# Patient Record
Sex: Female | Born: 1945 | Race: White | Hispanic: No | Marital: Married
Health system: Southern US, Community
[De-identification: ages and names within clinical notes are randomized; demographics above are authoritative.]

---

## 2004-09-22 ENCOUNTER — Inpatient Hospital Stay: Payer: Self-pay | Admitting: General Practice

## 2004-11-28 ENCOUNTER — Emergency Department: Payer: Self-pay | Admitting: Emergency Medicine

## 2004-11-29 ENCOUNTER — Ambulatory Visit: Payer: Self-pay

## 2004-12-14 ENCOUNTER — Ambulatory Visit: Payer: Self-pay

## 2004-12-28 ENCOUNTER — Ambulatory Visit: Payer: Self-pay

## 2005-01-31 ENCOUNTER — Ambulatory Visit: Payer: Self-pay

## 2005-02-13 ENCOUNTER — Ambulatory Visit: Payer: Self-pay | Admitting: Surgery

## 2005-02-20 ENCOUNTER — Observation Stay: Payer: Self-pay | Admitting: Surgery

## 2005-03-22 ENCOUNTER — Ambulatory Visit: Payer: Self-pay | Admitting: Oncology

## 2005-03-24 ENCOUNTER — Other Ambulatory Visit: Payer: Self-pay

## 2005-04-20 ENCOUNTER — Ambulatory Visit: Payer: Self-pay | Admitting: Oncology

## 2005-05-18 ENCOUNTER — Ambulatory Visit: Payer: Self-pay | Admitting: Oncology

## 2005-05-25 ENCOUNTER — Ambulatory Visit: Payer: Self-pay | Admitting: Surgery

## 2005-06-18 ENCOUNTER — Ambulatory Visit: Payer: Self-pay | Admitting: Oncology

## 2005-07-18 ENCOUNTER — Ambulatory Visit: Payer: Self-pay | Admitting: Oncology

## 2005-08-18 ENCOUNTER — Ambulatory Visit: Payer: Self-pay | Admitting: Oncology

## 2005-09-17 ENCOUNTER — Ambulatory Visit: Payer: Self-pay | Admitting: Oncology

## 2005-10-18 ENCOUNTER — Ambulatory Visit: Payer: Self-pay | Admitting: Oncology

## 2005-11-18 ENCOUNTER — Ambulatory Visit: Payer: Self-pay | Admitting: Oncology

## 2005-12-20 ENCOUNTER — Ambulatory Visit: Payer: Self-pay | Admitting: Oncology

## 2006-01-01 ENCOUNTER — Ambulatory Visit: Payer: Self-pay | Admitting: Surgery

## 2006-01-18 ENCOUNTER — Ambulatory Visit: Payer: Self-pay | Admitting: Oncology

## 2006-02-17 ENCOUNTER — Ambulatory Visit: Payer: Self-pay | Admitting: Oncology

## 2006-03-20 ENCOUNTER — Ambulatory Visit: Payer: Self-pay | Admitting: Oncology

## 2006-05-16 ENCOUNTER — Emergency Department: Payer: Self-pay | Admitting: Emergency Medicine

## 2006-05-18 ENCOUNTER — Ambulatory Visit: Payer: Self-pay | Admitting: Emergency Medicine

## 2006-06-06 ENCOUNTER — Ambulatory Visit: Payer: Self-pay | Admitting: Oncology

## 2006-06-19 ENCOUNTER — Ambulatory Visit: Payer: Self-pay | Admitting: Oncology

## 2006-07-19 ENCOUNTER — Ambulatory Visit: Payer: Self-pay | Admitting: Oncology

## 2006-08-19 ENCOUNTER — Ambulatory Visit: Payer: Self-pay | Admitting: Internal Medicine

## 2006-09-06 ENCOUNTER — Ambulatory Visit: Payer: Self-pay | Admitting: Oncology

## 2006-09-18 ENCOUNTER — Ambulatory Visit: Payer: Self-pay | Admitting: Oncology

## 2006-09-18 ENCOUNTER — Ambulatory Visit: Payer: Self-pay | Admitting: Internal Medicine

## 2006-10-19 ENCOUNTER — Ambulatory Visit: Payer: Self-pay | Admitting: Oncology

## 2006-11-01 ENCOUNTER — Ambulatory Visit: Payer: Self-pay | Admitting: Oncology

## 2006-11-19 ENCOUNTER — Ambulatory Visit: Payer: Self-pay | Admitting: Oncology

## 2006-12-05 ENCOUNTER — Ambulatory Visit: Payer: Self-pay | Admitting: Surgery

## 2006-12-19 ENCOUNTER — Ambulatory Visit: Payer: Self-pay | Admitting: Oncology

## 2006-12-25 ENCOUNTER — Ambulatory Visit: Payer: Self-pay | Admitting: Surgery

## 2006-12-27 ENCOUNTER — Ambulatory Visit: Payer: Self-pay | Admitting: Surgery

## 2007-01-19 ENCOUNTER — Ambulatory Visit: Payer: Self-pay | Admitting: Oncology

## 2007-01-30 ENCOUNTER — Ambulatory Visit: Payer: Self-pay | Admitting: Oncology

## 2007-02-11 ENCOUNTER — Ambulatory Visit: Payer: Self-pay | Admitting: Surgery

## 2007-02-18 ENCOUNTER — Ambulatory Visit: Payer: Self-pay | Admitting: Oncology

## 2007-04-04 ENCOUNTER — Ambulatory Visit: Payer: Self-pay | Admitting: Oncology

## 2007-04-21 ENCOUNTER — Ambulatory Visit: Payer: Self-pay | Admitting: Oncology

## 2007-05-19 ENCOUNTER — Ambulatory Visit: Payer: Self-pay | Admitting: Oncology

## 2007-06-19 ENCOUNTER — Ambulatory Visit: Payer: Self-pay | Admitting: Oncology

## 2007-07-19 ENCOUNTER — Ambulatory Visit: Payer: Self-pay | Admitting: Oncology

## 2007-08-06 ENCOUNTER — Ambulatory Visit: Payer: Self-pay | Admitting: Oncology

## 2007-08-19 ENCOUNTER — Ambulatory Visit: Payer: Self-pay | Admitting: Oncology

## 2007-09-18 ENCOUNTER — Ambulatory Visit: Payer: Self-pay | Admitting: Oncology

## 2007-10-19 ENCOUNTER — Ambulatory Visit: Payer: Self-pay | Admitting: Oncology

## 2007-11-19 ENCOUNTER — Ambulatory Visit: Payer: Self-pay | Admitting: Oncology

## 2008-03-23 ENCOUNTER — Ambulatory Visit: Payer: Self-pay | Admitting: Surgery

## 2008-04-20 ENCOUNTER — Ambulatory Visit: Payer: Self-pay | Admitting: Oncology

## 2008-05-12 ENCOUNTER — Ambulatory Visit: Payer: Self-pay | Admitting: Oncology

## 2008-05-18 ENCOUNTER — Ambulatory Visit: Payer: Self-pay | Admitting: Oncology

## 2008-08-18 ENCOUNTER — Ambulatory Visit: Payer: Self-pay | Admitting: Oncology

## 2008-09-16 ENCOUNTER — Ambulatory Visit: Payer: Self-pay | Admitting: Oncology

## 2008-09-17 ENCOUNTER — Ambulatory Visit: Payer: Self-pay | Admitting: Oncology

## 2008-10-18 ENCOUNTER — Ambulatory Visit: Payer: Self-pay | Admitting: Oncology

## 2008-11-16 ENCOUNTER — Ambulatory Visit: Payer: Self-pay | Admitting: Oncology

## 2008-11-18 ENCOUNTER — Ambulatory Visit: Payer: Self-pay | Admitting: Oncology

## 2009-02-16 ENCOUNTER — Ambulatory Visit: Payer: Self-pay | Admitting: Oncology

## 2009-02-17 ENCOUNTER — Ambulatory Visit: Payer: Self-pay | Admitting: Oncology

## 2009-03-25 ENCOUNTER — Ambulatory Visit: Payer: Self-pay | Admitting: Family Medicine

## 2009-04-20 ENCOUNTER — Ambulatory Visit: Payer: Self-pay | Admitting: Oncology

## 2009-05-17 ENCOUNTER — Ambulatory Visit: Payer: Self-pay | Admitting: Oncology

## 2009-05-18 ENCOUNTER — Ambulatory Visit: Payer: Self-pay | Admitting: Oncology

## 2009-06-18 ENCOUNTER — Ambulatory Visit: Payer: Self-pay | Admitting: Oncology

## 2009-07-18 ENCOUNTER — Ambulatory Visit: Payer: Self-pay | Admitting: Oncology

## 2009-10-18 ENCOUNTER — Ambulatory Visit: Payer: Self-pay | Admitting: Oncology

## 2009-11-15 ENCOUNTER — Ambulatory Visit: Payer: Self-pay | Admitting: Oncology

## 2009-11-16 LAB — CANCER ANTIGEN 27.29: CA 27.29: 17.3 U/mL (ref 0.0–38.6)

## 2009-11-18 ENCOUNTER — Ambulatory Visit: Payer: Self-pay | Admitting: Oncology

## 2010-03-31 ENCOUNTER — Ambulatory Visit: Payer: Self-pay | Admitting: Oncology

## 2010-05-17 ENCOUNTER — Ambulatory Visit: Payer: Self-pay | Admitting: Oncology

## 2010-05-18 LAB — CANCER ANTIGEN 27.29: CA 27.29: 31.1 U/mL (ref 0.0–38.6)

## 2010-05-19 ENCOUNTER — Ambulatory Visit: Payer: Self-pay | Admitting: Oncology

## 2010-11-25 ENCOUNTER — Ambulatory Visit: Payer: Self-pay | Admitting: Oncology

## 2010-11-26 LAB — CANCER ANTIGEN 27.29: CA 27.29: 38.7 U/mL — ABNORMAL HIGH (ref 0.0–38.6)

## 2010-12-19 ENCOUNTER — Ambulatory Visit: Payer: Self-pay | Admitting: Oncology

## 2010-12-20 ENCOUNTER — Ambulatory Visit: Payer: Self-pay | Admitting: Oncology

## 2010-12-21 ENCOUNTER — Ambulatory Visit: Payer: Self-pay | Admitting: Oncology

## 2011-01-04 ENCOUNTER — Ambulatory Visit: Payer: Self-pay | Admitting: Anesthesiology

## 2011-01-06 ENCOUNTER — Ambulatory Visit: Payer: Self-pay | Admitting: Surgery

## 2011-01-11 ENCOUNTER — Ambulatory Visit: Payer: Self-pay | Admitting: Oncology

## 2011-01-13 LAB — PATHOLOGY REPORT

## 2011-01-19 ENCOUNTER — Ambulatory Visit: Payer: Self-pay | Admitting: Oncology

## 2011-01-19 ENCOUNTER — Ambulatory Visit: Payer: Self-pay | Admitting: Internal Medicine

## 2011-01-30 ENCOUNTER — Emergency Department: Payer: Self-pay | Admitting: Emergency Medicine

## 2011-01-30 ENCOUNTER — Ambulatory Visit: Payer: Self-pay | Admitting: Internal Medicine

## 2011-02-07 ENCOUNTER — Inpatient Hospital Stay: Payer: Self-pay | Admitting: Oncology

## 2011-02-17 ENCOUNTER — Ambulatory Visit: Payer: Self-pay | Admitting: Nephrology

## 2011-02-18 ENCOUNTER — Ambulatory Visit: Payer: Self-pay | Admitting: Internal Medicine

## 2011-02-18 ENCOUNTER — Ambulatory Visit: Payer: Self-pay | Admitting: Oncology

## 2011-03-19 ENCOUNTER — Ambulatory Visit: Payer: Self-pay

## 2011-03-21 ENCOUNTER — Ambulatory Visit: Payer: Self-pay | Admitting: Oncology

## 2011-03-21 ENCOUNTER — Ambulatory Visit: Payer: Self-pay | Admitting: Internal Medicine

## 2011-03-22 LAB — CBC CANCER CENTER
Bands: 2 %
Basophil #: 0 x10 3/mm (ref 0.0–0.1)
Lymphocyte #: 0.8 x10 3/mm — ABNORMAL LOW (ref 1.0–3.6)
Lymphocytes: 25 %
MCH: 31.7 pg (ref 26.0–34.0)
MCV: 93.8 fL (ref 80–100)
Metamyelocyte: 1 %
Monocyte #: 0.6 x10 3/mm (ref 0.0–0.7)
Monocyte %: 23.8 %
Monocytes: 22 %
Neutrophil %: 46.6 %
Platelet: 196 x10 3/mm (ref 150–440)
RBC: 2.98 10*6/uL — ABNORMAL LOW (ref 3.80–5.20)
RDW: 15 % — ABNORMAL HIGH (ref 11.5–14.5)
WBC: 2.7 x10 3/mm — ABNORMAL LOW (ref 3.6–11.0)

## 2011-03-22 LAB — COMPREHENSIVE METABOLIC PANEL
Albumin: 2.2 g/dL — ABNORMAL LOW (ref 3.4–5.0)
Alkaline Phosphatase: 145 U/L — ABNORMAL HIGH (ref 50–136)
Anion Gap: 9 (ref 7–16)
BUN: 10 mg/dL (ref 7–18)
Bilirubin,Total: 0.9 mg/dL (ref 0.2–1.0)
Creatinine: 1.03 mg/dL (ref 0.60–1.30)
EGFR (African American): >60
Glucose: 126 mg/dL — ABNORMAL HIGH (ref 65–99)
Osmolality: 271 (ref 275–301)
Potassium: 2.7 mmol/L — ABNORMAL LOW (ref 3.5–5.1)
SGOT(AST): 28 U/L (ref 15–37)
SGPT (ALT): 15 U/L
Sodium: 135 mmol/L — ABNORMAL LOW (ref 136–145)
Total Protein: 5.6 g/dL — ABNORMAL LOW (ref 6.4–8.2)

## 2011-03-29 LAB — CBC CANCER CENTER
Basophil #: 0.1 x10 3/mm (ref 0.0–0.1)
Basophil %: 0.4 %
HCT: 29.8 % — ABNORMAL LOW (ref 35.0–47.0)
HGB: 10.2 g/dL — ABNORMAL LOW (ref 12.0–16.0)
Lymphocyte #: 1.6 x10 3/mm (ref 1.0–3.6)
Lymphocyte %: 8.3 %
MCV: 91 fL (ref 80–100)
Monocyte %: 3.8 %
Neutrophil #: 17.4 x10 3/mm — ABNORMAL HIGH (ref 1.4–6.5)
RBC: 3.28 10*6/uL — ABNORMAL LOW (ref 3.80–5.20)
RDW: 14.8 % — ABNORMAL HIGH (ref 11.5–14.5)
WBC: 19.9 x10 3/mm — ABNORMAL HIGH (ref 3.6–11.0)

## 2011-03-29 LAB — BASIC METABOLIC PANEL
Anion Gap: 9 (ref 7–16)
BUN: 11 mg/dL (ref 7–18)
Chloride: 100 mmol/L (ref 98–107)
Co2: 25 mmol/L (ref 21–32)
Creatinine: 0.91 mg/dL (ref 0.60–1.30)
Osmolality: 269 (ref 275–301)
Potassium: 2.8 mmol/L — ABNORMAL LOW (ref 3.5–5.1)
Sodium: 134 mmol/L — ABNORMAL LOW (ref 136–145)

## 2011-04-05 LAB — COMPREHENSIVE METABOLIC PANEL
Albumin: 2.3 g/dL — ABNORMAL LOW (ref 3.4–5.0)
Alkaline Phosphatase: 152 U/L — ABNORMAL HIGH (ref 50–136)
Bilirubin,Total: 1 mg/dL (ref 0.2–1.0)
Co2: 28 mmol/L (ref 21–32)
Creatinine: 0.95 mg/dL (ref 0.60–1.30)
EGFR (African American): >60
EGFR (Non-African Amer.): >60
SGPT (ALT): 17 U/L

## 2011-04-05 LAB — CBC CANCER CENTER
Basophil #: 0 x10 3/mm (ref 0.0–0.1)
Basophil %: 0.4 %
Eosinophil %: 0 %
HGB: 10.2 g/dL — ABNORMAL LOW (ref 12.0–16.0)
Lymphocyte %: 21.7 %
Monocyte %: 10.2 %
Neutrophil %: 67.7 %
RBC: 3.23 10*6/uL — ABNORMAL LOW (ref 3.80–5.20)
WBC: 4.5 x10 3/mm (ref 3.6–11.0)

## 2011-04-12 LAB — CBC CANCER CENTER
Eosinophil #: 0 x10 3/mm (ref 0.0–0.7)
Eosinophil %: 0.1 %
HGB: 9.2 g/dL — ABNORMAL LOW (ref 12.0–16.0)
Lymphocyte #: 0.7 x10 3/mm — ABNORMAL LOW (ref 1.0–3.6)
MCH: 31.1 pg (ref 26.0–34.0)
MCHC: 33.5 g/dL (ref 32.0–36.0)
MCV: 92.7 fL (ref 80–100)
Monocyte #: 0.3 x10 3/mm (ref 0.0–0.7)
Neutrophil %: 22.3 %
Platelet: 168 x10 3/mm (ref 150–440)
RBC: 2.96 10*6/uL — ABNORMAL LOW (ref 3.80–5.20)
RDW: 16.3 % — ABNORMAL HIGH (ref 11.5–14.5)

## 2011-04-12 LAB — COMPREHENSIVE METABOLIC PANEL
Anion Gap: 9 (ref 7–16)
BUN: 10 mg/dL (ref 7–18)
Calcium, Total: 8.3 mg/dL — ABNORMAL LOW (ref 8.5–10.1)
Chloride: 106 mmol/L (ref 98–107)
Co2: 25 mmol/L (ref 21–32)
Creatinine: 0.94 mg/dL (ref 0.60–1.30)
EGFR (African American): >60
EGFR (Non-African Amer.): >60
Potassium: 3.2 mmol/L — ABNORMAL LOW (ref 3.5–5.1)
SGOT(AST): 44 U/L — ABNORMAL HIGH (ref 15–37)
Total Protein: 5.5 g/dL — ABNORMAL LOW (ref 6.4–8.2)

## 2011-04-19 LAB — CBC CANCER CENTER
Basophil %: 0.5 %
Eosinophil %: 0.1 %
HCT: 31.6 % — ABNORMAL LOW (ref 35.0–47.0)
Lymphocyte #: 0.9 x10 3/mm — ABNORMAL LOW (ref 1.0–3.6)
Lymphocyte %: 18.7 %
MCV: 92.1 fL (ref 80–100)
Monocyte %: 13.5 %
Neutrophil %: 67.2 %
Platelet: 260 x10 3/mm (ref 150–440)
RBC: 3.44 10*6/uL — ABNORMAL LOW (ref 3.80–5.20)

## 2011-04-19 LAB — COMPREHENSIVE METABOLIC PANEL
Albumin: 2.5 g/dL — ABNORMAL LOW (ref 3.4–5.0)
Alkaline Phosphatase: 163 U/L — ABNORMAL HIGH (ref 50–136)
BUN: 10 mg/dL (ref 7–18)
Bilirubin,Total: 0.9 mg/dL (ref 0.2–1.0)
Chloride: 107 mmol/L (ref 98–107)
Co2: 25 mmol/L (ref 21–32)
Creatinine: 0.92 mg/dL (ref 0.60–1.30)
EGFR (African American): >60
Glucose: 105 mg/dL — ABNORMAL HIGH (ref 65–99)
Osmolality: 281 (ref 275–301)
SGOT(AST): 45 U/L — ABNORMAL HIGH (ref 15–37)
SGPT (ALT): 21 U/L
Total Protein: 5.7 g/dL — ABNORMAL LOW (ref 6.4–8.2)

## 2011-04-21 ENCOUNTER — Ambulatory Visit: Payer: Self-pay | Admitting: Oncology

## 2011-04-24 ENCOUNTER — Emergency Department: Payer: Self-pay | Admitting: Emergency Medicine

## 2011-04-24 LAB — URINALYSIS, COMPLETE
Bacteria: NEGATIVE
Ketone: NEGATIVE
Nitrite: NEGATIVE
Ph: 6 (ref 4.5–8.0)
Protein: NEGATIVE
Specific Gravity: 1.005 (ref 1.003–1.030)
Squamous Epithelial: 3
WBC UR: NONE SEEN /HPF (ref 0–5)
WBC UR: 2 /HPF (ref 0–5)

## 2011-04-24 LAB — CBC WITH DIFFERENTIAL/PLATELET
Basophil: 2 %
Lymphocytes: 9 %
MCHC: 33.4 g/dL (ref 32.0–36.0)
Monocytes: 6 %
Myelocyte: 1 %
NRBC/100 WBC: 1 /
Other Cells Blood: 1
Platelet: 129 10*3/uL — ABNORMAL LOW (ref 150–440)
RDW: 18.2 % — ABNORMAL HIGH (ref 11.5–14.5)
Segmented Neutrophils: 62 %
Variant Lymphocyte - H1-Rlymph: 2 %
WBC: 4 10*3/uL (ref 3.6–11.0)

## 2011-04-24 LAB — BASIC METABOLIC PANEL
BUN: 9 mg/dL (ref 7–18)
Calcium, Total: 8.5 mg/dL (ref 8.5–10.1)
Chloride: 107 mmol/L (ref 98–107)
Co2: 19 mmol/L — ABNORMAL LOW (ref 21–32)
Creatinine: 0.84 mg/dL (ref 0.60–1.30)
EGFR (Non-African Amer.): >60
Osmolality: 278 (ref 275–301)
Sodium: 141 mmol/L (ref 136–145)

## 2011-04-24 LAB — PROTIME-INR
INR: 1.2
Prothrombin Time: 15.7 secs — ABNORMAL HIGH (ref 11.5–14.7)

## 2011-04-24 LAB — HEPATIC FUNCTION PANEL A (ARMC)
Alkaline Phosphatase: 162 U/L — ABNORMAL HIGH (ref 50–136)
SGOT(AST): 66 U/L — ABNORMAL HIGH (ref 15–37)
SGPT (ALT): 25 U/L
Total Protein: 5.6 g/dL — ABNORMAL LOW (ref 6.4–8.2)

## 2011-04-26 LAB — COMPREHENSIVE METABOLIC PANEL
Alkaline Phosphatase: 187 U/L — ABNORMAL HIGH (ref 50–136)
BUN: 9 mg/dL (ref 7–18)
Bilirubin,Total: 1 mg/dL (ref 0.2–1.0)
Chloride: 106 mmol/L (ref 98–107)
Co2: 24 mmol/L (ref 21–32)
Creatinine: 1.04 mg/dL (ref 0.60–1.30)
EGFR (Non-African Amer.): 57 — ABNORMAL LOW
SGOT(AST): 53 U/L — ABNORMAL HIGH (ref 15–37)
SGPT (ALT): 22 U/L
Sodium: 138 mmol/L (ref 136–145)
Total Protein: 5.3 g/dL — ABNORMAL LOW (ref 6.4–8.2)

## 2011-04-26 LAB — CBC CANCER CENTER
Basophil #: 0.1 x10 3/mm (ref 0.0–0.1)
Eosinophil %: 0 %
HCT: 26.8 % — ABNORMAL LOW (ref 35.0–47.0)
HGB: 8.8 g/dL — ABNORMAL LOW (ref 12.0–16.0)
Lymphocyte %: 11.7 %
Monocyte %: 6.4 %
Neutrophil %: 80.8 %
Platelet: 147 x10 3/mm — ABNORMAL LOW (ref 150–440)
RBC: 2.97 10*6/uL — ABNORMAL LOW (ref 3.80–5.20)
RDW: 17.2 % — ABNORMAL HIGH (ref 11.5–14.5)
WBC: 10.4 x10 3/mm (ref 3.6–11.0)

## 2011-05-03 LAB — COMPREHENSIVE METABOLIC PANEL
Albumin: 2.5 g/dL — ABNORMAL LOW (ref 3.4–5.0)
Alkaline Phosphatase: 156 U/L — ABNORMAL HIGH (ref 50–136)
Anion Gap: 7 (ref 7–16)
BUN: 12 mg/dL (ref 7–18)
Chloride: 108 mmol/L — ABNORMAL HIGH (ref 98–107)
Co2: 26 mmol/L (ref 21–32)
Creatinine: 0.93 mg/dL (ref 0.60–1.30)
Osmolality: 280 (ref 275–301)
Sodium: 141 mmol/L (ref 136–145)
Total Protein: 5.5 g/dL — ABNORMAL LOW (ref 6.4–8.2)

## 2011-05-03 LAB — CBC CANCER CENTER
Basophil #: 0 x10 3/mm (ref 0.0–0.1)
Basophil %: 0.4 %
Eosinophil %: 0.1 %
HCT: 30 % — ABNORMAL LOW (ref 35.0–47.0)
HGB: 9.7 g/dL — ABNORMAL LOW (ref 12.0–16.0)
Lymphocyte #: 0.8 x10 3/mm — ABNORMAL LOW (ref 1.0–3.6)
MCH: 29.5 pg (ref 26.0–34.0)
MCV: 91.5 fL (ref 80–100)
Monocyte #: 0.4 x10 3/mm (ref 0.0–0.7)
Neutrophil #: 3.9 x10 3/mm (ref 1.4–6.5)
RBC: 3.28 10*6/uL — ABNORMAL LOW (ref 3.80–5.20)
WBC: 5.1 x10 3/mm (ref 3.6–11.0)

## 2011-05-19 ENCOUNTER — Ambulatory Visit: Payer: Self-pay | Admitting: Oncology

## 2011-05-23 LAB — CBC CANCER CENTER
Basophil %: 0.8 %
Eosinophil #: 0 x10 3/mm (ref 0.0–0.7)
Eosinophil %: 0.3 %
HGB: 8.5 g/dL — ABNORMAL LOW (ref 12.0–16.0)
Lymphocyte #: 0.7 x10 3/mm — ABNORMAL LOW (ref 1.0–3.6)
MCH: 30.4 pg (ref 26.0–34.0)
MCHC: 33.7 g/dL (ref 32.0–36.0)
MCV: 90 fL (ref 80–100)
Monocyte #: 0.4 x10 3/mm (ref 0.0–0.7)
Neutrophil %: 62.6 %
Platelet: 183 x10 3/mm (ref 150–440)

## 2011-05-23 LAB — BASIC METABOLIC PANEL
Anion Gap: 12 (ref 7–16)
Calcium, Total: 8.1 mg/dL — ABNORMAL LOW (ref 8.5–10.1)
Chloride: 106 mmol/L (ref 98–107)
Co2: 25 mmol/L (ref 21–32)
Creatinine: 0.9 mg/dL (ref 0.60–1.30)
EGFR (African American): >60
Glucose: 70 mg/dL (ref 65–99)
Osmolality: 284 (ref 275–301)
Potassium: 3.3 mmol/L — ABNORMAL LOW (ref 3.5–5.1)

## 2011-05-26 ENCOUNTER — Encounter: Payer: Self-pay | Admitting: Nurse Practitioner

## 2011-05-26 ENCOUNTER — Encounter: Payer: Self-pay | Admitting: Cardiothoracic Surgery

## 2011-06-05 LAB — CBC CANCER CENTER
Basophil #: 0 x10 3/mm (ref 0.0–0.1)
Eosinophil #: 0.1 x10 3/mm (ref 0.0–0.7)
Eosinophil %: 4.2 %
HCT: 27.2 % — ABNORMAL LOW (ref 35.0–47.0)
Lymphocyte #: 0.5 x10 3/mm — ABNORMAL LOW (ref 1.0–3.6)
MCH: 31.4 pg (ref 26.0–34.0)
MCV: 92 fL (ref 80–100)
Neutrophil #: 1.8 x10 3/mm (ref 1.4–6.5)
Neutrophil %: 67 %
Platelet: 195 x10 3/mm (ref 150–440)
RBC: 2.96 10*6/uL — ABNORMAL LOW (ref 3.80–5.20)
RDW: 21.9 % — ABNORMAL HIGH (ref 11.5–14.5)

## 2011-06-12 LAB — CBC CANCER CENTER
Basophil %: 0.3 %
Eosinophil %: 4.9 %
HCT: 27.8 % — ABNORMAL LOW (ref 35.0–47.0)
HGB: 9.5 g/dL — ABNORMAL LOW (ref 12.0–16.0)
Lymphocyte #: 0.4 x10 3/mm — ABNORMAL LOW (ref 1.0–3.6)
Lymphocyte %: 20.6 %
MCHC: 34.3 g/dL (ref 32.0–36.0)
Monocyte #: 0.2 x10 3/mm (ref 0.0–0.7)
Monocyte %: 10.4 %
Neutrophil #: 1.3 x10 3/mm — ABNORMAL LOW (ref 1.4–6.5)
Neutrophil %: 63.8 %
WBC: 2 x10 3/mm — CL (ref 3.6–11.0)

## 2011-06-19 ENCOUNTER — Ambulatory Visit: Payer: Self-pay | Admitting: Oncology

## 2011-06-19 ENCOUNTER — Encounter: Payer: Self-pay | Admitting: Cardiothoracic Surgery

## 2011-06-19 ENCOUNTER — Encounter: Payer: Self-pay | Admitting: Nurse Practitioner

## 2011-06-28 LAB — COMPREHENSIVE METABOLIC PANEL
Alkaline Phosphatase: 171 U/L — ABNORMAL HIGH (ref 50–136)
Anion Gap: 5 — ABNORMAL LOW (ref 7–16)
BUN: 15 mg/dL (ref 7–18)
Bilirubin,Total: 0.9 mg/dL (ref 0.2–1.0)
Calcium, Total: 8.5 mg/dL (ref 8.5–10.1)
Chloride: 106 mmol/L (ref 98–107)
Creatinine: 0.94 mg/dL (ref 0.60–1.30)
EGFR (African American): >60
Glucose: 107 mg/dL — ABNORMAL HIGH (ref 65–99)
Osmolality: 277 (ref 275–301)
Potassium: 4 mmol/L (ref 3.5–5.1)
SGPT (ALT): 23 U/L
Sodium: 138 mmol/L (ref 136–145)

## 2011-06-28 LAB — CBC CANCER CENTER
Basophil #: 0 x10 3/mm (ref 0.0–0.1)
Eosinophil #: 0.1 x10 3/mm (ref 0.0–0.7)
Eosinophil %: 7.4 %
Lymphocyte %: 19.5 %
MCH: 32.6 pg (ref 26.0–34.0)
MCHC: 33.6 g/dL (ref 32.0–36.0)
Monocyte %: 12.4 %
Neutrophil #: 1.2 x10 3/mm — ABNORMAL LOW (ref 1.4–6.5)
Platelet: 93 x10 3/mm — ABNORMAL LOW (ref 150–440)
RDW: 18.3 % — ABNORMAL HIGH (ref 11.5–14.5)
WBC: 2 x10 3/mm — CL (ref 3.6–11.0)

## 2011-07-13 ENCOUNTER — Ambulatory Visit: Payer: Self-pay | Admitting: Oncology

## 2011-07-19 ENCOUNTER — Ambulatory Visit: Payer: Self-pay | Admitting: Oncology

## 2011-08-19 ENCOUNTER — Ambulatory Visit: Payer: Self-pay | Admitting: Oncology

## 2011-08-19 DEATH — deceased

## 2012-10-03 IMAGING — US US PELV - US TRANSVAGINAL
1 series · 17 of 25 positions shown · non-contrast
Comparison: none

REASON FOR EXAM: post menopausal bleeding
COMMENTS:

[Series 1: us pelv - us transvaginal · 17 of 47 slices shown]
[im 1/47]
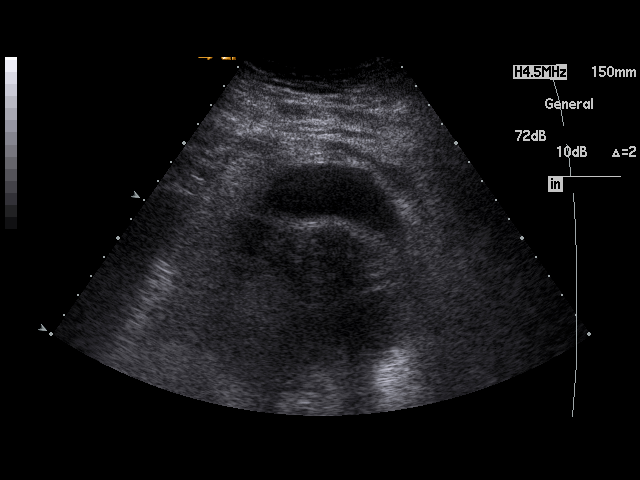
[im 4/47]
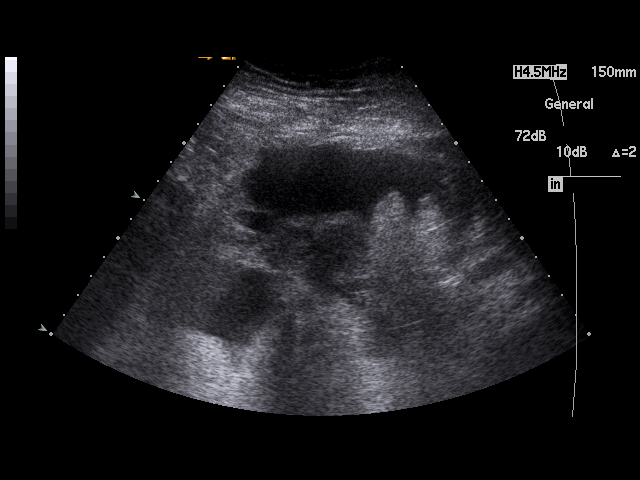
[im 6/47]
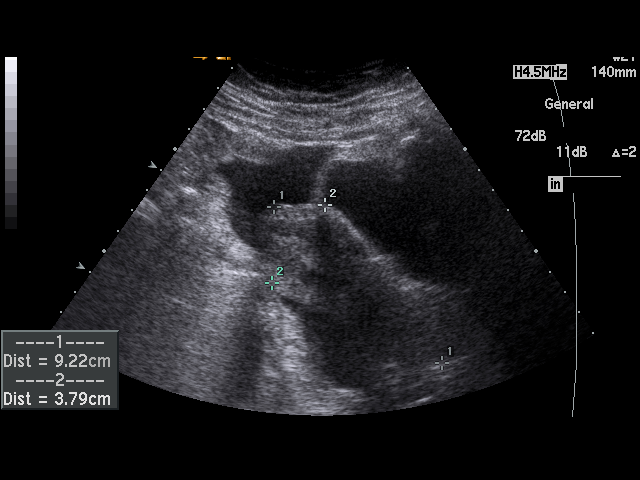
[im 10/47]
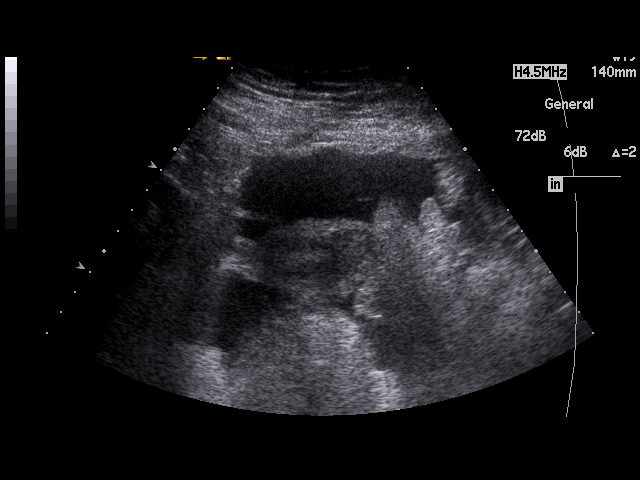
[im 12/47]
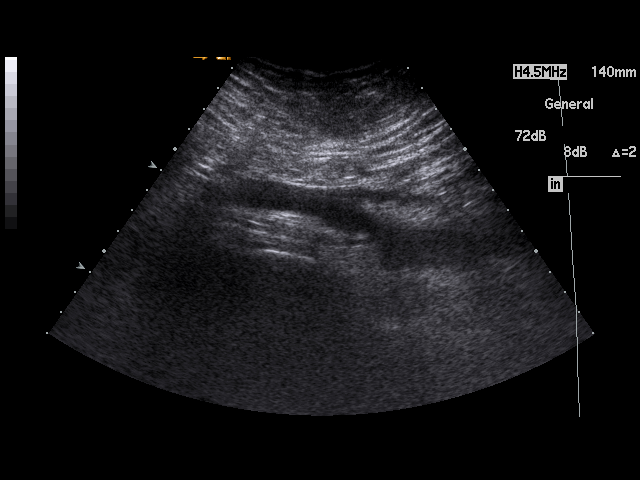
[im 16/47]
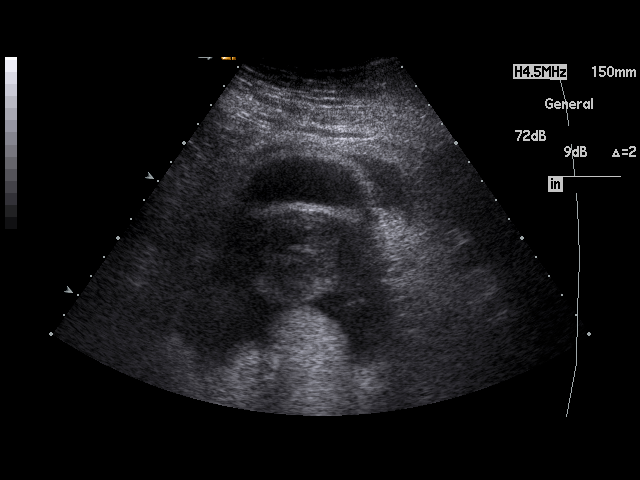
[im 18/47]
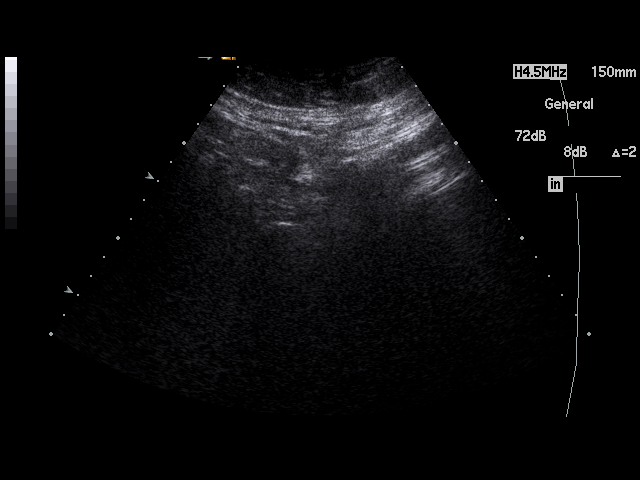
[im 22/47]
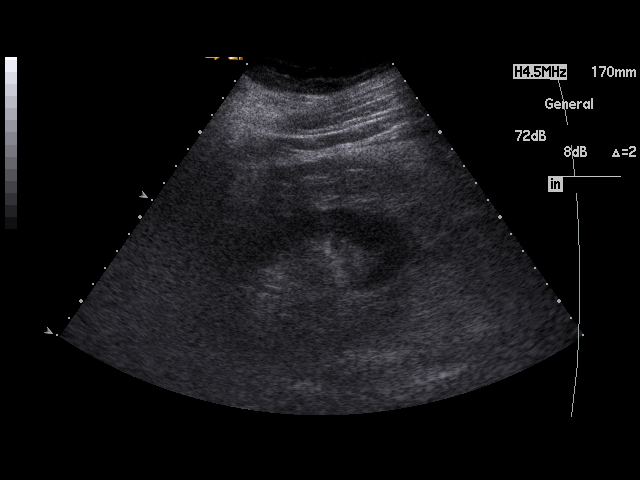
[im 24/47]
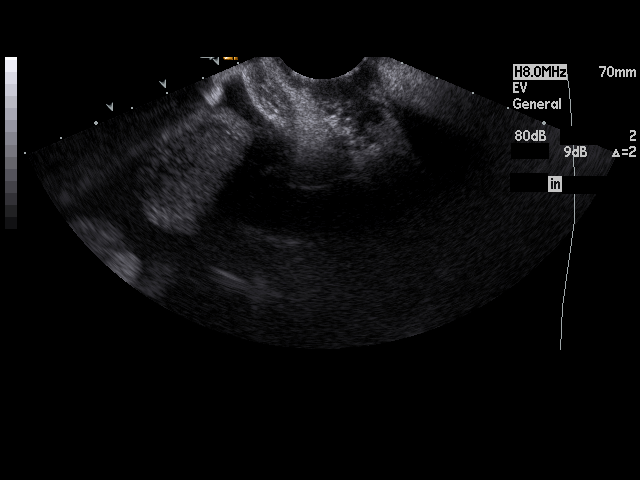
[im 25/47]
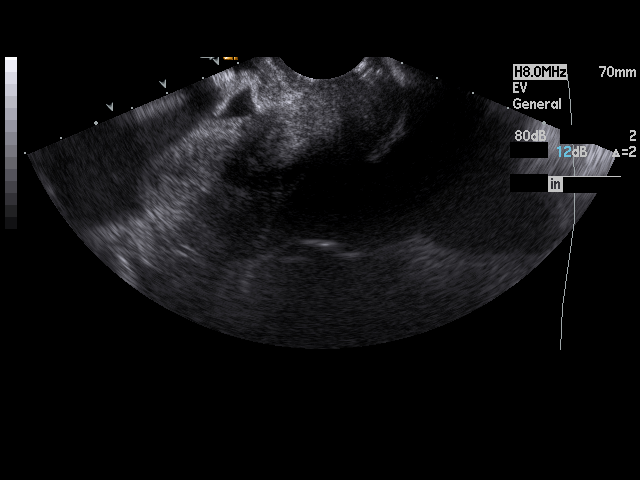
[im 29/47]
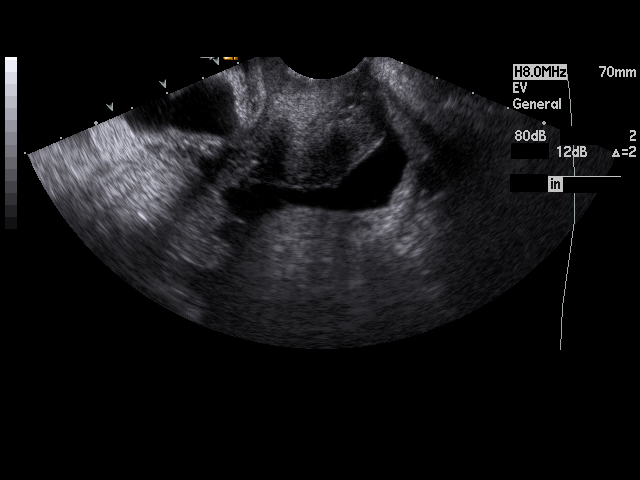
[im 31/47]
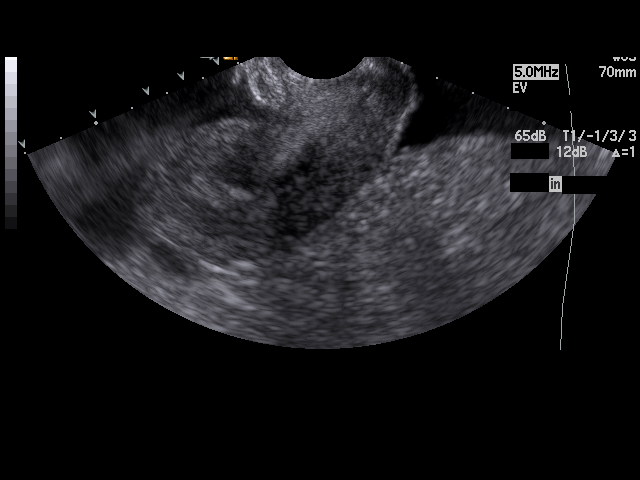
[im 35/47]
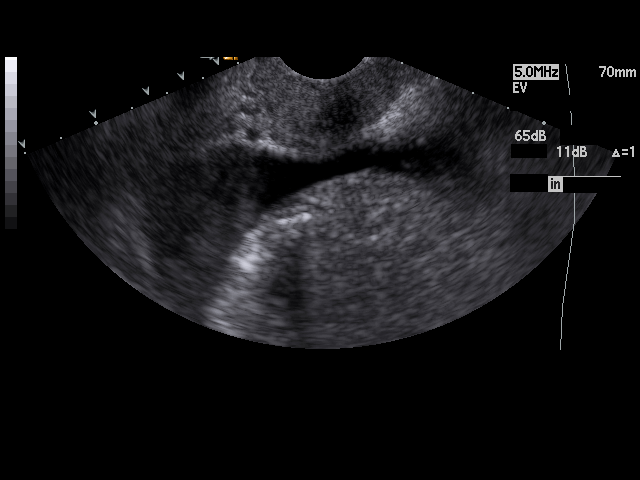
[im 37/47]
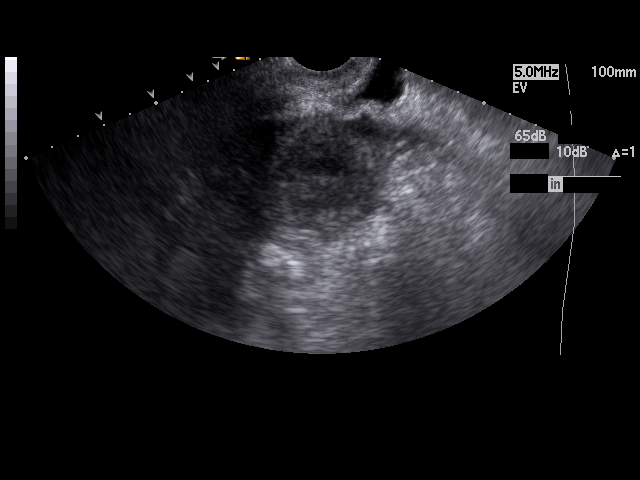
[im 41/47]
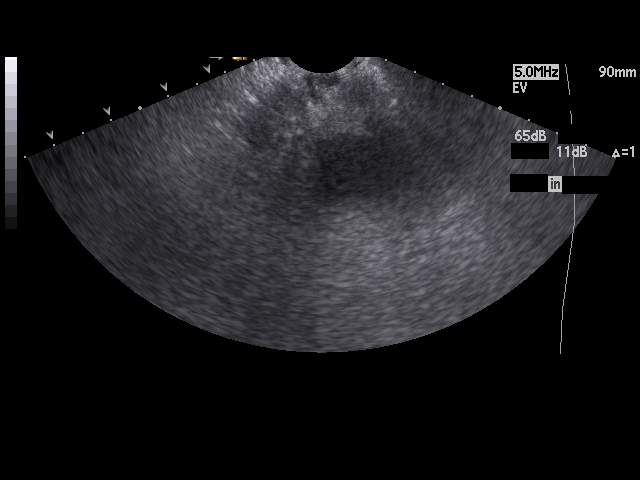
[im 43/47]
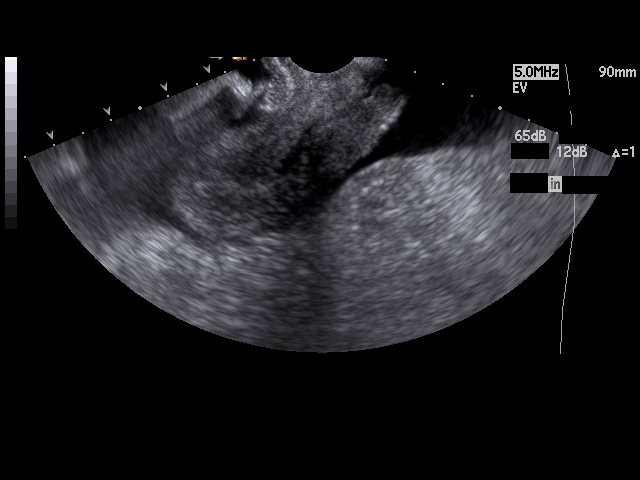
[im 47/47]
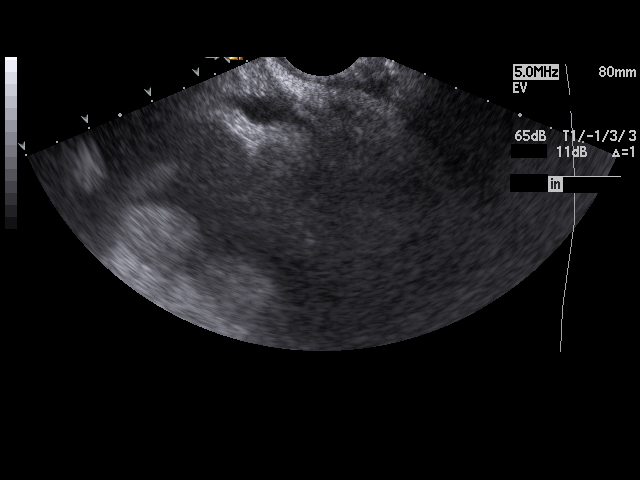

[17 of 25 positions shown; findings below may reference images not displayed]

PROCEDURE:     US  - US PELVIS EXAM W/TRANSVAGINAL  - May 17, 2011  [DATE]

RESULT:     Transabdominal and endovaginal ultrasound was performed. The
uterus measured 9.22 cm x 4.9 cm x 3.79 cm. The endometrium measures 7.1 mm
in thickness which is increased for the postmenopausal patient. No uterine
mass is observed. The ovaries are not seen on either side and apparently are
atrophic or obscured. No abnormal adnexal masses are identified. There is a
moderately large amount of fluid observed in the pelvis. The kidneys are
visualized bilaterally and show no hydronephrosis.
IMPRESSION: 1. There is noted a large amount of fluid in the pelvis. Additionally, fluid
is seen extending to the right upper quadrant and is visualized above the
liver.
2. The uterine endometrium measures 7.1 mm in thickness which is increased
for the patient's age.
3. No uterine mass is seen.
4. Neither ovary is visualized on this exam.

## 2012-11-29 IMAGING — CT NM PET TUM IMG RESTAG (PS) SKULL BASE T - THIGH
1 of 5 series · 1 of 25 positions shown · non-contrast
Comparison: none

REASON FOR EXAM: restaging Breast CA
COMMENTS:

[Series 3: ct wb 3.0 b30f · axial · 3.0mm · 0.98mm/px · 1 of 435 slices shown]
[im 435/435  brain]
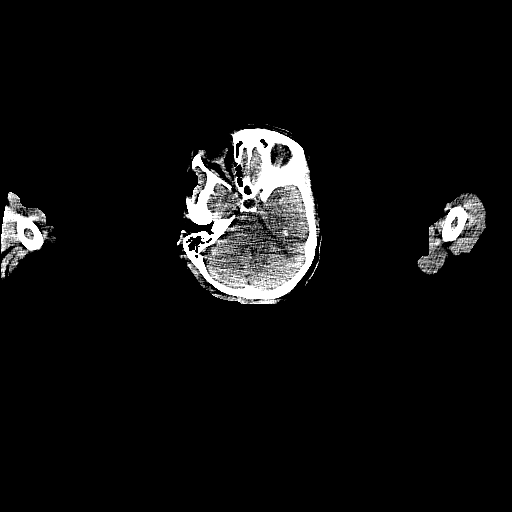

[1 of 25 positions shown; findings below may reference images not displayed]

PROCEDURE:     PET - PET/CT RESTG BREAST CA  - July 13, 2011  [DATE]

RESULT:     The patient has a fasting blood glucose level measuring 90
mg/dL. The patient received an injection of 12.51 mCi of fluorine-39 labeled
fluorodeoxyglucose in the left hand at [DATE] a.m. with imaging obtained from
the base of the brain into the thighs between the hours of [DATE] and [DATE]
a.m. Noncontrast low dose CT of the same regions is performed for the
purposes of attenuation, correction and fusion. The noncontrast CT,
attenuation corrected PET images and fused PET/CT data are reconstructed by
the [employer] software in the axial, coronal and sagittal planes.
Correlation is made with the previous exam dated 21 December, 2010.

The patient has developed a moderately large amount of ascites with a small
left pleural effusion and a trace right pleural effusion. Soft tissue
density is seen over the left buttock region measuring 5.15 x 2.64 cm. This
is in the subcutaneous fatty tissues and is stable compared to the previous
exam. Cholecystectomy clips are present. Atherosclerotic calcification is
present. There is asymmetric increased density in the upper left breast with
evidence of previous left breast surgery. There is no evidence of abnormal
localization of F-18 FDG.
IMPRESSION: 1. No F-18 FDG avid malignant disease evident.
2. CT findings of development of a large amount of ascites and small pleural
effusion.

[REDACTED]

## 2014-07-12 NOTE — Consult Note (Signed)
Reason for Visit: This 69 year old Female patient presents to the clinic for initial evaluation of  Metastatic breast cancer .   Referred by Dr. Grayland Ormond.  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 Gary Fleet care for metastatic breast cancer has been on multiple chemotherapy regimens. About a month ago she presented with vaginal bleeding. He was fairly minimal at 1st. She was seen by Dr. Sabra Heck who performed endocervical curettings and biopsy both positive for metastatic carcinoma consistent with breast primary. She also has skin lesions from stasis changes. She is morbidly obese. I been asked to evaluate her for possible palliative radiation therapy to prevent further bleeding. Bleeding seems to have worsened since biopsy.  Past Hx:    lower back problems: bulging disc   Kidney Stones:    Obesity:    dizziness:    Cancer, Breast:    Oncology Protocol: NSABP B-36   GERD:    Cardiac cath:    Arthritis:    Thyroid problems:    Hypertension:    Port placement/removal:    Right total knee replacement:    Tubaligation:    Heel surgery:    Breast surgery:   Past, Family and Social History:   Past Medical History positive    Cardiovascular hyperlipidemia; hypertension    Gastrointestinal GERD    Genitourinary kidney stones    Endocrine hypothyroidism    Past Surgical History Tubal ligation, he will surgery, breast surgery as described above, right total knee replacement, tubal ligation,     Past Medical History Comments Morbid obesity, dizziness, arthritis    Family History positive    Family History Comments Family history positive for breast cancer and multiple myeloma, adult onset diabetes, hypertension and cardiac cardiac vascular heart disease    Social History positive    Social History Comments Greater than 40-pack-year smoking history    Additional Past Medical and Surgical History Accompanied by husband and family member today   Allergies:   No Known Allergies:   Home Meds:  Home Medications: Medication Instructions Status  Duragesic-25 25 mcg/hr film, extended release 1 PATCH transdermal every 72 hours x 30 days Active  potassium chloride 20 mEq tablet, extended release 1 tab(s) orally once a day x 30 days Active  cyclobenzaprine 10 mg tablet 1 tab(s) orally 3 times a day x 30 days for muscle spasms. Active  synthyroid 0.075 mcg  once a day  Active  aspirin 81 mg oral tablet 1 tab(s) orally once a day Active  acetaminophen-hydrocodone 500 mg-5 mg oral tablet 1 tab(s) orally every 6 to 8 hours x 30 days as needed Active  nystatin topical 100000 units/g cream  Active  glipiZIDE 10 mg oral tablet 2 tab(s) orally 2 times a day Active  bumetanide 1 mg oral tablet 1 tab(s) orally once a day Active   Review of Systems:   General negative    Performance Status (ECOG) 1    Skin see HPI    Breast see HPI    Ophthalmologic negative  ENMT negative    Respiratory and Thorax negative    Cardiovascular negative    Gastrointestinal negative    Genitourinary see HPI    Musculoskeletal negative    Neurological negative    Psychiatric negative    Hematology/Lymphatics negative    Endocrine see HPI    Allergic/Immunologic negative   Physical Exam:  General/Skin/HEENT:   Additional PE Well-developed wheelchair-bound morbidly obese female in NAD. Lungs are clear to A&P cardiac examination shows regular rate and rhythm without murmur rub or thrill.  Abdomen is obese no organomegaly or masses are identified. She does have stasis changes in the groin and lower abdomen. She has +2 bilateral lower extremity edema.   Breasts/Resp/CV/GI/GU:   Respiratory and Thorax normal    Cardiovascular normal    Gastrointestinal normal    Genitourinary normal   MS/Neuro/Psych/Lymph:   Musculoskeletal normal    Neurological normal   Other Results:  Radiology Results: Nuclear Med:    03-Oct-12 12:11, PET/CT Scan Breast CA Stage/Restaging   PET/CT Scan Breast CA Stage/Restaging    REASON FOR EXAM:    Diabetic Metformiin Hx Breast CA With Biopsy    Recurrent Carcinoma LN of Neck  COMMENTS:       PROCEDURE: PET - PET/CT RESTG BREAST CA  - Dec 21 2010 12:11PM       RESULT:     History: Breast cancer.    Comparison Study: Neck CT of 11/30/2010.    Procedure and Findings: Multiplanar, multisequence imaging of the body is   obtained. A fasting blood sugar of 153 mg/dl is noted; 12.47 mCi of FDG     was admitted to the patient. CT was obtained for attenuation, correction   and fusion. PET positive right supraclavicular, right retrocaval   pretracheal region, subcarinal region, and bilateral hilar region lymph   nodes are noted. SUV levels up to 5 are noted. A faintly PET positive   nodule is noted in the left lower lobe. A subtle PET positive lesion is   noted in the left ischium and left scapula at the level of the glenoid.     IMPRESSION:   1. PET positive lymph nodes in the right supraclavicular, retrocaval   pretracheal mediastinal, subcarinal, and bilateral hilar lymph nodes.   2. PET positive left lower lobe nodule. This is faint.  3. PET positive lesions in the left scapular glenoid and left ischium.      Thank you for the opportunity to contribute to the care of your patient.     Verified By: Osa Craver, M.D., MD   Assessment and Plan:  Impression:   metastatic breast cancer to endometrium in 69 year old female with  known stage IV breast cancer  Plan:   I have ordered a CT scan today of her abdomen and pelvis to evaluate this the extent been nature of her endometrial involvement. Would plan on a course of 3000 cGy over 3 weeks of palliative radiation to try and control endometrial bleeding. We are evaluating her hematocrit today to see if transfusion is necessary. I discussed the case personally with Dr. Grayland Ormond and he agrees with palliative radiation at this time. I have set her up later this week for CT simulation right after CT scan is performed. Risks and benefits of treatment including possible diarrhea and dysuria were both explained to the patient and her husband and both seem to comprehend my treatment plan well.  I would like to take this opportunity to thank you for  allowing me to continue to participate in this patient's care.  CC Referral:   cc: Dr. Clemmie Krill   Electronic Signatures: Baruch Gouty, Roda Shutters (MD)  (Signed [phone] 16:17)  Authored: HPI, Diagnosis, Past Hx, PFSH, Allergies, Home Meds, ROS, Physical Exam, Other Results, Encounter Assessment and Plan, CC Referring Physician   Last Updated: 05-Mar-13 16:17 by Armstead Peaks (MD)
